# Patient Record
Sex: Female | Born: 1985 | Race: Black or African American | Hispanic: No | Marital: Single | Smoking: Never smoker
Health system: Southern US, Community
[De-identification: ages and names within clinical notes are randomized; demographics above are authoritative.]

## PROBLEM LIST (undated history)

## (undated) DIAGNOSIS — J45909 Unspecified asthma, uncomplicated: Secondary | ICD-10-CM

---

## 2020-01-05 ENCOUNTER — Encounter (HOSPITAL_COMMUNITY): Payer: Self-pay | Admitting: *Deleted

## 2020-01-05 ENCOUNTER — Emergency Department (HOSPITAL_COMMUNITY)
Admission: EM | Admit: 2020-01-05 | Discharge: 2020-01-05 | Disposition: A | Discharge status: Home / Self Care | Payer: Medicaid - Out of State | Attending: Emergency Medicine | Admitting: Emergency Medicine

## 2020-01-05 ENCOUNTER — Emergency Department (HOSPITAL_COMMUNITY): Payer: Medicaid - Out of State

## 2020-01-05 ENCOUNTER — Other Ambulatory Visit: Payer: Self-pay

## 2020-01-05 DIAGNOSIS — N83291 Other ovarian cyst, right side: Secondary | ICD-10-CM | POA: Diagnosis not present

## 2020-01-05 DIAGNOSIS — J45909 Unspecified asthma, uncomplicated: Secondary | ICD-10-CM | POA: Insufficient documentation

## 2020-01-05 DIAGNOSIS — R1012 Left upper quadrant pain: Secondary | ICD-10-CM | POA: Insufficient documentation

## 2020-01-05 DIAGNOSIS — R1032 Left lower quadrant pain: Secondary | ICD-10-CM | POA: Diagnosis present

## 2020-01-05 DIAGNOSIS — D259 Leiomyoma of uterus, unspecified: Secondary | ICD-10-CM | POA: Insufficient documentation

## 2020-01-05 DIAGNOSIS — N83201 Unspecified ovarian cyst, right side: Secondary | ICD-10-CM

## 2020-01-05 HISTORY — DX: Unspecified asthma, uncomplicated: J45.909

## 2020-01-05 LAB — URINALYSIS, ROUTINE W REFLEX MICROSCOPIC
Bacteria, UA: NONE SEEN
Bilirubin Urine: NEGATIVE
Glucose, UA: NEGATIVE mg/dL
Ketones, ur: NEGATIVE mg/dL
Leukocytes,Ua: NEGATIVE
Nitrite: NEGATIVE
Protein, ur: NEGATIVE mg/dL
Specific Gravity, Urine: 1.024 (ref 1.005–1.030)
pH: 5 (ref 5.0–8.0)

## 2020-01-05 LAB — DIFFERENTIAL
Abs Immature Granulocytes: 0.05 10*3/uL (ref 0.00–0.07)
Basophils Absolute: 0 10*3/uL (ref 0.0–0.1)
Basophils Relative: 0 %
Eosinophils Absolute: 0.1 10*3/uL (ref 0.0–0.5)
Eosinophils Relative: 1 %
Immature Granulocytes: 0 %
Lymphocytes Relative: 13 %
Lymphs Abs: 1.9 10*3/uL (ref 0.7–4.0)
Monocytes Absolute: 1 10*3/uL (ref 0.1–1.0)
Monocytes Relative: 7 %
Neutro Abs: 12 10*3/uL — ABNORMAL HIGH (ref 1.7–7.7)
Neutrophils Relative %: 79 %

## 2020-01-05 LAB — I-STAT BETA HCG BLOOD, ED (MC, WL, AP ONLY): I-stat hCG, quantitative: <5 m[IU]/mL (ref <5)

## 2020-01-05 LAB — CBC
HCT: 40.5 % (ref 36.0–46.0)
Hemoglobin: 13 g/dL (ref 12.0–15.0)
MCH: 28.3 pg (ref 26.0–34.0)
MCHC: 32.1 g/dL (ref 30.0–36.0)
MCV: 88 fL (ref 80.0–100.0)
Platelets: 347 10*3/uL (ref 150–400)
RBC: 4.6 MIL/uL (ref 3.87–5.11)
RDW: 15.1 % (ref 11.5–15.5)
WBC: 19 10*3/uL — ABNORMAL HIGH (ref 4.0–10.5)
nRBC: 0 % (ref 0.0–0.2)

## 2020-01-05 LAB — COMPREHENSIVE METABOLIC PANEL
ALT: 19 U/L (ref 0–44)
AST: 15 U/L (ref 15–41)
Albumin: 4.6 g/dL (ref 3.5–5.0)
Alkaline Phosphatase: 71 U/L (ref 38–126)
Anion gap: 10 (ref 5–15)
BUN: 19 mg/dL (ref 6–20)
CO2: 24 mmol/L (ref 22–32)
Calcium: 9.6 mg/dL (ref 8.9–10.3)
Chloride: 104 mmol/L (ref 98–111)
Creatinine, Ser: 0.82 mg/dL (ref 0.44–1.00)
GFR calc Af Amer: >60 mL/min (ref >60)
GFR calc non Af Amer: >60 mL/min (ref >60)
Glucose, Bld: 106 mg/dL — ABNORMAL HIGH (ref 70–99)
Potassium: 4.3 mmol/L (ref 3.5–5.1)
Sodium: 138 mmol/L (ref 135–145)
Total Bilirubin: 0.4 mg/dL (ref 0.3–1.2)
Total Protein: 8.2 g/dL — ABNORMAL HIGH (ref 6.5–8.1)

## 2020-01-05 LAB — LIPASE, BLOOD: Lipase: 85 U/L — ABNORMAL HIGH (ref 11–51)

## 2020-01-05 MED ORDER — SODIUM CHLORIDE (PF) 0.9 % IJ SOLN
INTRAMUSCULAR | Status: AC
  Filled 2020-01-05: qty 50

## 2020-01-05 MED ORDER — SODIUM CHLORIDE 0.9 % IV BOLUS
1000.0000 mL | Freq: Once | INTRAVENOUS | Status: AC
  Administered 2020-01-05: 1000 mL via INTRAVENOUS

## 2020-01-05 MED ORDER — ONDANSETRON HCL 4 MG/2ML IJ SOLN
4.0000 mg | Freq: Once | INTRAMUSCULAR | Status: AC
  Administered 2020-01-05: 4 mg via INTRAVENOUS
  Filled 2020-01-05: qty 2

## 2020-01-05 MED ORDER — SODIUM CHLORIDE 0.9% FLUSH: 3.0000 mL | Freq: Once | INTRAVENOUS | Status: DC

## 2020-01-05 MED ORDER — HYDROCODONE-ACETAMINOPHEN 5-325 MG PO TABS: 1.0000 | ORAL_TABLET | ORAL | 0 refills | Status: AC | PRN

## 2020-01-05 MED ORDER — PIPERACILLIN-TAZOBACTAM 3.375 G IVPB 30 MIN
3.3750 g | Freq: Once | INTRAVENOUS | Status: AC
  Administered 2020-01-05: 3.375 g via INTRAVENOUS
  Filled 2020-01-05: qty 50

## 2020-01-05 MED ORDER — IOHEXOL 300 MG/ML  SOLN
100.0000 mL | Freq: Once | INTRAMUSCULAR | Status: AC | PRN
  Administered 2020-01-05: 100 mL via INTRAVENOUS

## 2020-01-05 MED ORDER — MORPHINE SULFATE (PF) 4 MG/ML IV SOLN
4.0000 mg | Freq: Once | INTRAVENOUS | Status: AC
  Administered 2020-01-05: 4 mg via INTRAVENOUS
  Filled 2020-01-05: qty 1

## 2020-01-05 NOTE — ED Provider Notes (Signed)
[facility] DEPT Provider Note   CSN: [id number] Arrival date & time: 01/05/20  1309     History Chief Complaint  Patient presents with  . Abdominal Pain    Left    Taylor Nicholson is a 34 y.o. female with PMHx asthma who presents to the ED today complaining of sudden onset, constant, achy/sharp, left sided abdominal pain that began at 8 AM this morning. PT also complains of nausea, 1 episode of nonbilious nonbloody emesis, and 2 looser stools.  Reports she has never had pain like this in the past.  She is denying any fever, chills, chest pain, shortness of breath, blood in stool, urinary symptoms, pelvic pain, any other associated symptoms. LNMP 2 weeks ago.   The history is provided by the patient.       Past Medical History:  Diagnosis Date  . Asthma     There are no problems to display for this patient.   History reviewed. No pertinent surgical history.   OB History   No obstetric history on file.     No family history on file.  Social History   Tobacco Use  . Smoking status: Never Smoker  . Smokeless tobacco: Never Used  Substance Use Topics  . Alcohol use: Never  . Drug use: Never    Home Medications Prior to Admission medications   Medication Sig Start Date End Date Taking? Authorizing Provider  albuterol (VENTOLIN HFA) 108 (90 Base) MCG/ACT inhaler Inhale 2 puffs into the lungs every 6 (six) hours as needed for wheezing or shortness of breath.   Yes [provider]  HYDROcodone-acetaminophen (NORCO/VICODIN) 5-325 MG tablet Take 1 tablet by mouth every 4 (four) hours as needed for severe pain. 01/05/20   Eustaquio Maize, PA-C    Allergies    Patient has no known allergies.  Review of Systems   Review of Systems  Constitutional: Negative for chills and fever.  Respiratory: Negative for shortness of breath.   Cardiovascular: Negative for chest pain.  Gastrointestinal: Positive for abdominal pain, diarrhea,  nausea and vomiting.  Genitourinary: Negative for difficulty urinating, dysuria, flank pain, frequency, pelvic pain and vaginal pain.  Musculoskeletal: Negative for myalgias.  All other systems reviewed and are negative.   Physical Exam Updated Vital Signs BP 107/74   Pulse 73   Temp 98.6 F (37 C) (Oral)   Resp 20   Wt 73.9 kg   SpO2 100%   Physical Exam Vitals and nursing note reviewed.  Constitutional:      Appearance: She is not ill-appearing or diaphoretic.  HENT:     Head: Normocephalic and atraumatic.  Eyes:     Conjunctiva/sclera: Conjunctivae normal.  Cardiovascular:     Rate and Rhythm: Normal rate and regular rhythm.  Pulmonary:     Effort: Pulmonary effort is normal.     Breath sounds: Normal breath sounds. No wheezing, rhonchi or rales.  Chest:     Chest wall: No tenderness.  Abdominal:     Palpations: Abdomen is soft.     Tenderness: There is abdominal tenderness in the periumbilical area, left upper quadrant and left lower quadrant. There is no right CVA tenderness, left CVA tenderness, guarding or rebound.  Musculoskeletal:     Cervical back: Neck supple.  Skin:    General: Skin is warm and dry.  Neurological:     Mental Status: She is alert.     ED Results / Procedures / Treatments   Labs (all labs ordered  are listed, but only abnormal results are displayed) Labs Reviewed  LIPASE, BLOOD - Abnormal; Notable for the following components:      Result Value   Lipase 85 (*)    All other components within normal limits  COMPREHENSIVE METABOLIC PANEL - Abnormal; Notable for the following components:   Glucose, Bld 106 (*)    Total Protein 8.2 (*)    All other components within normal limits  CBC - Abnormal; Notable for the following components:   WBC 19.0 (*)    All other components within normal limits  URINALYSIS, ROUTINE W REFLEX MICROSCOPIC - Abnormal; Notable for the following components:   Hgb urine dipstick SMALL (*)    All other components  within normal limits  DIFFERENTIAL - Abnormal; Notable for the following components:   Neutro Abs 12.0 (*)    All other components within normal limits  I-STAT BETA HCG BLOOD, ED ([facility], [facility], [facility] ONLY)    EKG None  Radiology US Transvaginal Non-OB  Result Date: 01/05/2020 CLINICAL DATA:  Initial evaluation for acute left lower quadrant pain. EXAM: TRANSABDOMINAL AND TRANSVAGINAL ULTRASOUND OF PELVIS DOPPLER ULTRASOUND OF OVARIES TECHNIQUE: Both transabdominal and transvaginal ultrasound examinations of the pelvis were performed. Transabdominal technique was performed for global imaging of the pelvis including uterus, ovaries, adnexal regions, and pelvic cul-de-sac. It was necessary to proceed with endovaginal exam following the transabdominal exam to visualize the uterus, endometrium, and ovaries. Color and duplex Doppler ultrasound was utilized to evaluate blood flow to the ovaries. COMPARISON:  Prior CT from earlier the same day. FINDINGS: Uterus Measurements: 7.7 x 3.6 x 4.3 cm = volume: 61.7 mL. 1.3 x 0.8 x 1.5 cm exophytic densely calcified fibroid seen along the left aspect of the uterus, also seen on prior CT. Endometrium Thickness: 8 mm.  No focal abnormality visualized. Right ovary Measurements: 4.7 x 3.7 x 3.8 cm = volume: 35.1 mL. 3.4 x 3.3 x 2.7 cm simple anechoic cyst, most compatible with a normal physiologic follicular cyst. Left ovary Not visualized.  No adnexal mass. Pulsed Doppler evaluation of the right ovary demonstrates normal low-resistance arterial and venous waveforms. Other findings No abnormal free fluid. IMPRESSION: 1. 3.4 cm simple right ovarian cyst, most compatible with a normal physiologic follicular cyst. This has benign characteristics and is a common finding in premenopausal females. No imaging follow up is required. This follows consensus guidelines: Simple Adnexal Cysts: SRU Consensus Conference Update on Follow-up and Reporting. Radiology 2019; [id number]. 2.  Nonvisualization of the left ovary. No left adnexal mass or free fluid within the pelvis. 3. 1.5 cm densely calcified fibroid at the left aspect of the uterus. 4. No evidence for ovarian torsion or other acute abnormality. Electronically Signed   By: Jeannine Boga M.D.   On: 01/05/2020 20:53   US Pelvis Complete  Result Date: 01/05/2020 CLINICAL DATA:  Initial evaluation for acute left lower quadrant pain. EXAM: TRANSABDOMINAL AND TRANSVAGINAL ULTRASOUND OF PELVIS DOPPLER ULTRASOUND OF OVARIES TECHNIQUE: Both transabdominal and transvaginal ultrasound examinations of the pelvis were performed. Transabdominal technique was performed for global imaging of the pelvis including uterus, ovaries, adnexal regions, and pelvic cul-de-sac. It was necessary to proceed with endovaginal exam following the transabdominal exam to visualize the uterus, endometrium, and ovaries. Color and duplex Doppler ultrasound was utilized to evaluate blood flow to the ovaries. COMPARISON:  Prior CT from earlier the same day. FINDINGS: Uterus Measurements: 7.7 x 3.6 x 4.3 cm = volume: 61.7 mL. 1.3 x 0.8 x 1.5 cm  exophytic densely calcified fibroid seen along the left aspect of the uterus, also seen on prior CT. Endometrium Thickness: 8 mm.  No focal abnormality visualized. Right ovary Measurements: 4.7 x 3.7 x 3.8 cm = volume: 35.1 mL. 3.4 x 3.3 x 2.7 cm simple anechoic cyst, most compatible with a normal physiologic follicular cyst. Left ovary Not visualized.  No adnexal mass. Pulsed Doppler evaluation of the right ovary demonstrates normal low-resistance arterial and venous waveforms. Other findings No abnormal free fluid. IMPRESSION: 1. 3.4 cm simple right ovarian cyst, most compatible with a normal physiologic follicular cyst. This has benign characteristics and is a common finding in premenopausal females. No imaging follow up is required. This follows consensus guidelines: Simple Adnexal Cysts: SRU Consensus Conference Update on  Follow-up and Reporting. Radiology 2019; [id number]. 2. Nonvisualization of the left ovary. No left adnexal mass or free fluid within the pelvis. 3. 1.5 cm densely calcified fibroid at the left aspect of the uterus. 4. No evidence for ovarian torsion or other acute abnormality. Electronically Signed   By: Jeannine Boga M.D.   On: 01/05/2020 20:53   CT Abdomen Pelvis W Contrast  Result Date: 01/05/2020 CLINICAL DATA:  Left lower quadrant pain EXAM: CT ABDOMEN AND PELVIS WITH CONTRAST TECHNIQUE: Multidetector CT imaging of the abdomen and pelvis was performed using the standard protocol following bolus administration of intravenous contrast. CONTRAST:  1103mL OMNIPAQUE IOHEXOL 300 MG/ML  SOLN COMPARISON:  None. FINDINGS: Lower chest: No acute abnormality. Hepatobiliary: No focal liver abnormality is seen. No gallstones, gallbladder wall thickening, or biliary dilatation. Pancreas: Unremarkable. Spleen: Unremarkable. Adrenals/Urinary Tract: Adrenals are normal. Kidneys demonstrate normal enhancement. No hydronephrosis. Stomach/Bowel: Moderate hiatal hernia. Stomach is otherwise within normal limits. Small bowel is normal in caliber. Few colonic diverticula. The colon is fluid and air-filled but nondistended. Normal appendix. Vascular/Lymphatic: No significant vascular findings are present. No enlarged abdominal or pelvic lymph nodes. Reproductive: 1.6 cm calcified fibroid along the left aspect of the ureters appears subserosal. Right adnexal cystic lesion measuring up to 3.8 cm. Other: Fat containing left inguinal hernia. Musculoskeletal: Unremarkable. IMPRESSION: Fluid and air-filled, but nondistended colon. Right adnexal cystic lesion measuring 3.8 cm. Moderate hiatal hernia. Electronically Signed   By: Macy Mis M.D.   On: 01/05/2020 19:30   [place] Art/Ven Flow Abd Pelv Doppler  Result Date: 01/05/2020 CLINICAL DATA:  Initial evaluation for acute left lower quadrant pain. EXAM: TRANSABDOMINAL AND  TRANSVAGINAL ULTRASOUND OF PELVIS DOPPLER ULTRASOUND OF OVARIES TECHNIQUE: Both transabdominal and transvaginal ultrasound examinations of the pelvis were performed. Transabdominal technique was performed for global imaging of the pelvis including uterus, ovaries, adnexal regions, and pelvic cul-de-sac. It was necessary to proceed with endovaginal exam following the transabdominal exam to visualize the uterus, endometrium, and ovaries. Color and duplex Doppler ultrasound was utilized to evaluate blood flow to the ovaries. COMPARISON:  Prior CT from earlier the same day. FINDINGS: Uterus Measurements: 7.7 x 3.6 x 4.3 cm = volume: 61.7 mL. 1.3 x 0.8 x 1.5 cm exophytic densely calcified fibroid seen along the left aspect of the uterus, also seen on prior CT. Endometrium Thickness: 8 mm.  No focal abnormality visualized. Right ovary Measurements: 4.7 x 3.7 x 3.8 cm = volume: 35.1 mL. 3.4 x 3.3 x 2.7 cm simple anechoic cyst, most compatible with a normal physiologic follicular cyst. Left ovary Not visualized.  No adnexal mass. Pulsed Doppler evaluation of the right ovary demonstrates normal low-resistance arterial and venous waveforms. Other findings No abnormal free fluid. IMPRESSION:  1. 3.4 cm simple right ovarian cyst, most compatible with a normal physiologic follicular cyst. This has benign characteristics and is a common finding in premenopausal females. No imaging follow up is required. This follows consensus guidelines: Simple Adnexal Cysts: SRU Consensus Conference Update on Follow-up and Reporting. Radiology 2019; [id number]. 2. Nonvisualization of the left ovary. No left adnexal mass or free fluid within the pelvis. 3. 1.5 cm densely calcified fibroid at the left aspect of the uterus. 4. No evidence for ovarian torsion or other acute abnormality. Electronically Signed   By: Jeannine Boga M.D.   On: 01/05/2020 20:53    Procedures Procedures (including critical care time)  Medications Ordered in  ED Medications  sodium chloride flush (NS) 0.9 % injection 3 mL (0 mLs Intravenous Hold 01/05/20 1810)  sodium chloride (PF) 0.9 % injection (0 mLs  Hold 01/05/20 1846)  piperacillin-tazobactam (ZOSYN) IVPB 3.375 g (0 g Intravenous Stopped 01/05/20 2027)  sodium chloride 0.9 % bolus 1,000 mL (0 mLs Intravenous Stopped 01/05/20 2027)  ondansetron (ZOFRAN) injection 4 mg (4 mg Intravenous Given 01/05/20 1836)  morphine 4 MG/ML injection 4 mg (4 mg Intravenous Given 01/05/20 1836)  iohexol (OMNIPAQUE) 300 MG/ML solution 100 mL (100 mLs Intravenous Contrast Given 01/05/20 1857)    ED Course  I have reviewed the triage vital signs and the nursing notes.  Pertinent labs & imaging results that were available during my care of the patient were reviewed by me and considered in my medical decision making (see chart for details).  34 year old female presents the ED complaining of left lower quadrant/left upper quadrant abdominal pain, nausea, 2 looser stools that began this morning.  On arrival to the ED patient is afebrile, nontachycardic, nontachypneic.  She has tenderness to the periumbilical, left upper quadrant, left lower quadrant.  No CVA tenderness.  No urinary symptoms, no pelvic pain.  She denies heavy alcohol use.  Given amount of discomfort will obtain CT scan.  Reevaluate.  Morphine, IV fluids, Zofran given for pain.   CBC with white blood cell count of 19,000.  Will empirically start on Zosyn with concern for intra-abdominal infection.  CMP without any electrolyte abnormalities.  Analysis with small amount of hemoglobin although patient states she is currently menstruating, she states that she has irregular periods and last had one about 2 weeks ago which is normal for her.  She is denying any pelvic pain.  Her lipase is mildly elevated at 85 although not 3 times the upper limit of normal.   CT scan without acute findings.  No pancreatitis.  Does have a right adnexal cystic lesion.  Will obtain  pelvic ultrasound at this time.   Ultrasound with ovarian cyst on right side and fibroid on left.  No torsion.  On repeat exam patient is lying comfortably in bed texting on phone.  She has no abdominal tenderness on repeat exam.  Will she is stable for discharge at this time.  I will prescribe a very short course of pain medication for her to take as needed although I have stated that she should take ibuprofen and if she has breakthrough pain she can take the narcotic pain medication.  I have encouraged increased fluid intake.  Patient is to follow-up with OB/GYN regarding her ultrasound findings today.  Patient declines pelvic exam at this time.  She states she is not having any pelvic pain and last had intercourse in September, is not concerned for STDs today.  Feel that this is appropriate.  Patient to be discharged home at this time.  Strict return precautions have been discussed with patient.  She is in agreement plan and stable for discharge home.   This note was prepared using Dragon voice recognition software and may include unintentional dictation errors due to the inherent limitations of voice recognition software.    MDM Rules/Calculators/A&P                      Final Clinical Impression(s) / ED Diagnoses Final diagnoses:  Left upper quadrant abdominal pain  Uterine leiomyoma, unspecified location  Cyst of right ovary    Rx / DC Orders ED Discharge Orders         Ordered    HYDROcodone-acetaminophen (NORCO/VICODIN) 5-325 MG tablet  Every 4 hours PRN     01/05/20 2148           Discharge Instructions     Please take pain medication as prescribed. Increase your fluids to stay hydrated.   Follow up with OBGYN regarding your ovarian cyst/fibroids  Return to the ED for any worsening symptoms including worsening pain, inability to tolerate fluids, chest pain, shortness of breath, vomiting blood, pelvic pain.        Eustaquio Maize, PA-C 01/05/20 2153    Carmin Muskrat, MD 01/05/20 [phone]

## 2020-01-05 NOTE — ED Notes (Signed)
Patient in CT at this time.

## 2020-01-05 NOTE — ED Triage Notes (Signed)
EMS states pt developed pain in LLQ this morning, some nausea, 2 loose stools, 110/88-80-18-98% RA CBG 99 98.1

## 2020-01-05 NOTE — Discharge Instructions (Addendum)
Please take pain medication as prescribed. Increase your fluids to stay hydrated.   Follow up with OBGYN regarding your ovarian cyst/fibroids  Return to the ED for any worsening symptoms including worsening pain, inability to tolerate fluids, chest pain, shortness of breath, vomiting blood, pelvic pain.

## 2021-05-17 IMAGING — US US PELVIS COMPLETE
1 series · 13 of 25 positions shown · non-contrast
Comparison: Prior CT from earlier the same day.

CLINICAL DATA: Initial evaluation for acute left lower quadrant
pain.

EXAM:
TRANSABDOMINAL AND TRANSVAGINAL ULTRASOUND OF PELVIS
DOPPLER ULTRASOUND OF OVARIES
TECHNIQUE: Both transabdominal and transvaginal ultrasound examinations of the
pelvis were performed. Transabdominal technique was performed for
global imaging of the pelvis including uterus, ovaries, adnexal
regions, and pelvic cul-de-sac.
It was necessary to proceed with endovaginal exam following the
transabdominal exam to visualize the uterus, endometrium, and
ovaries. Color and duplex Doppler ultrasound was utilized to
evaluate blood flow to the ovaries.

[Series 1: us pelvis complete · 13 of 128 slices shown]
[im 1/128]
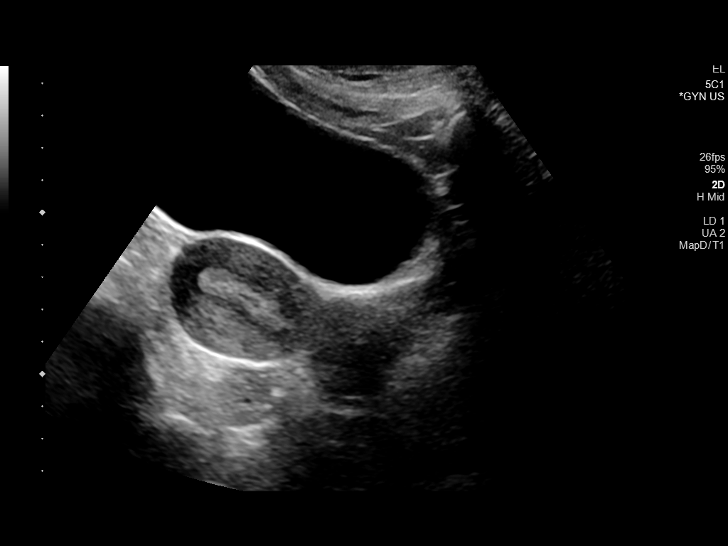
[im 11/128]
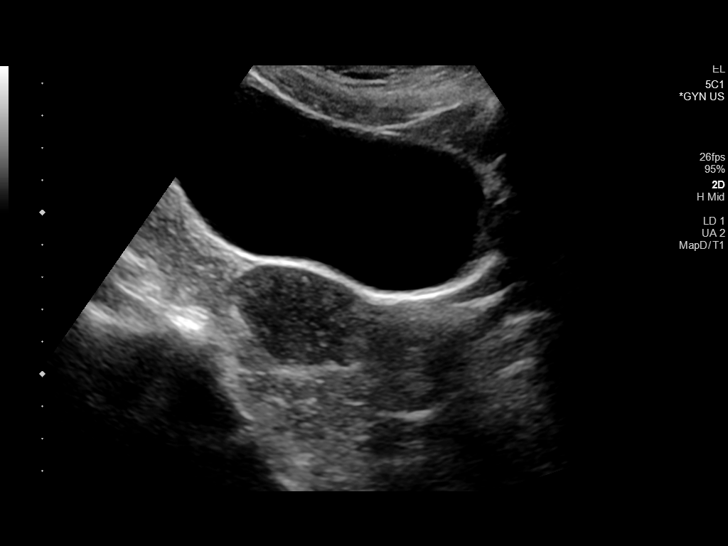
[im 22/128]
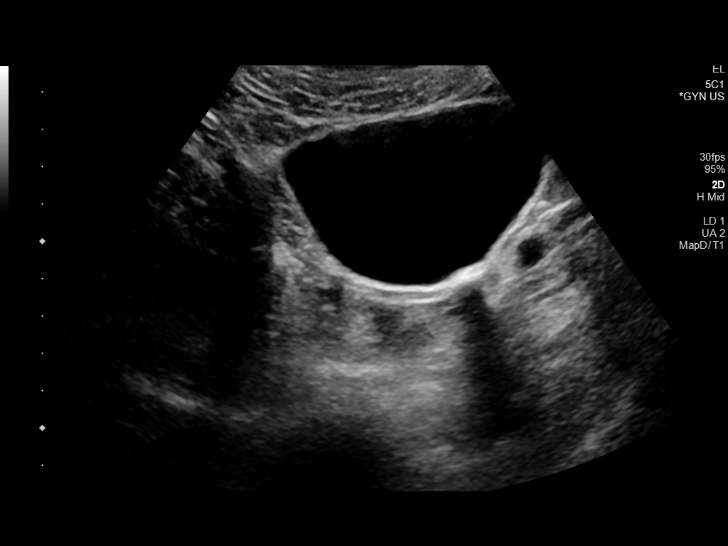
[im 32/128]
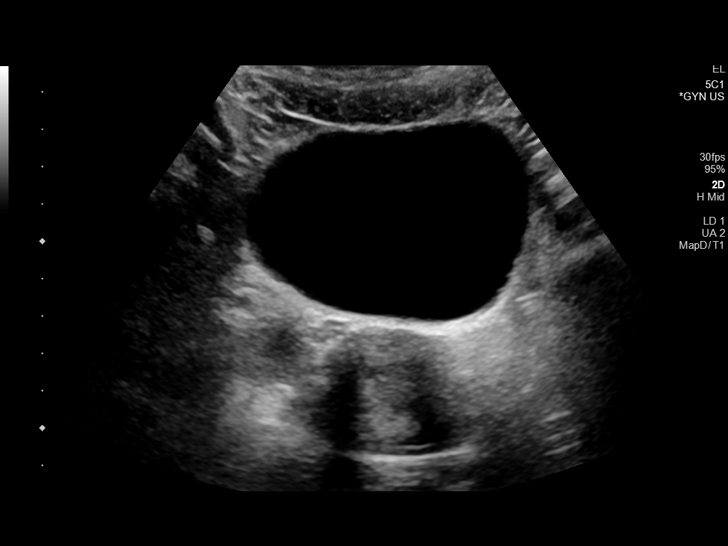
[im 43/128]
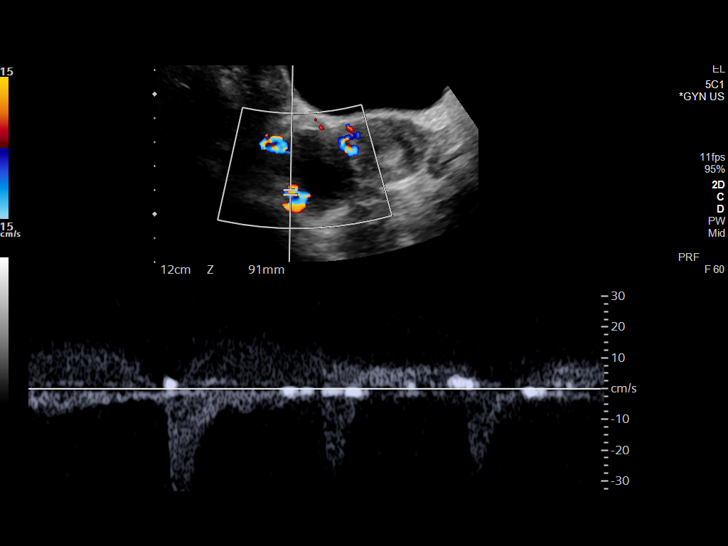
[im 53/128]
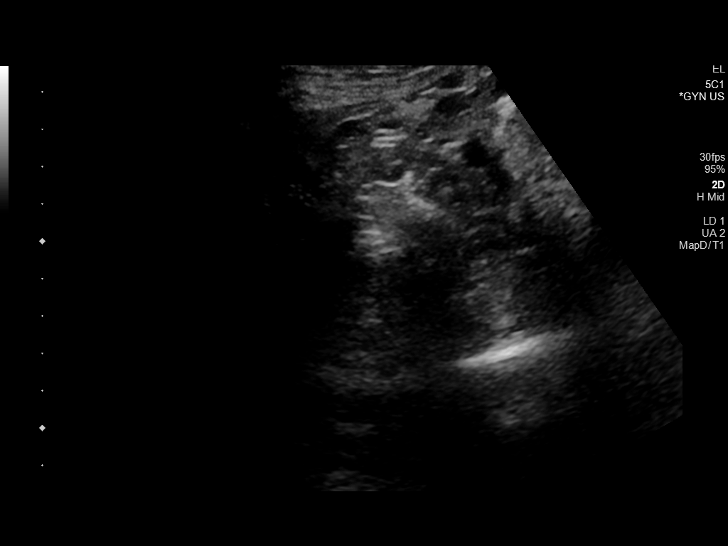
[im 64/128]
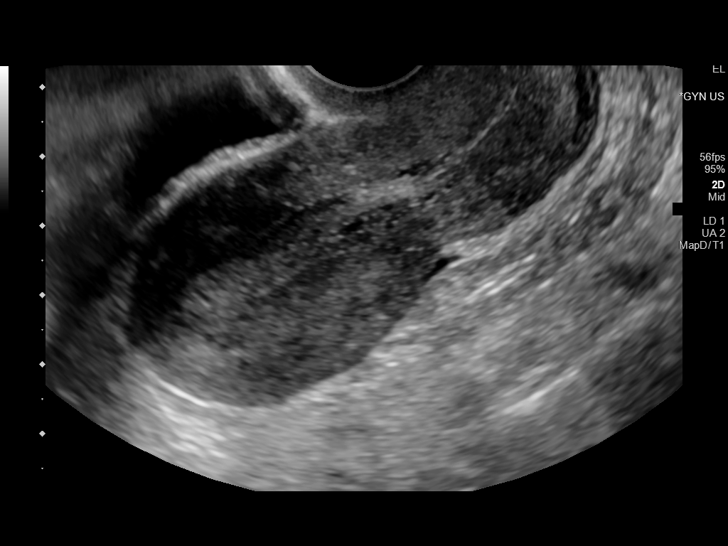
[im 75/128]
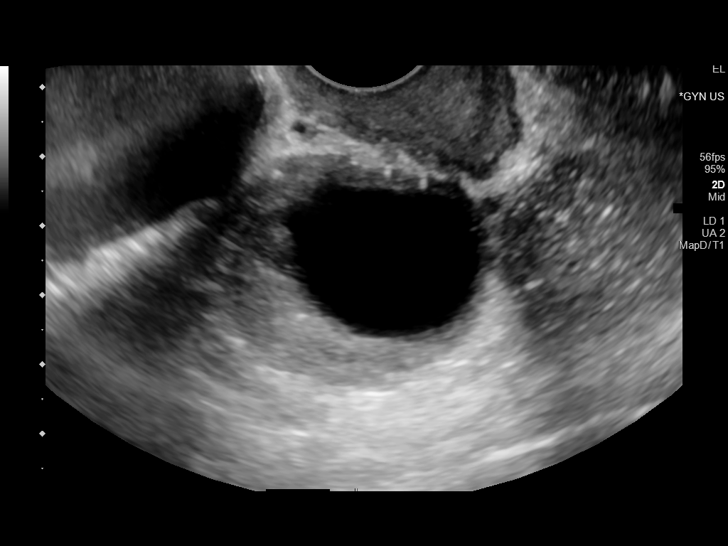
[im 85/128]
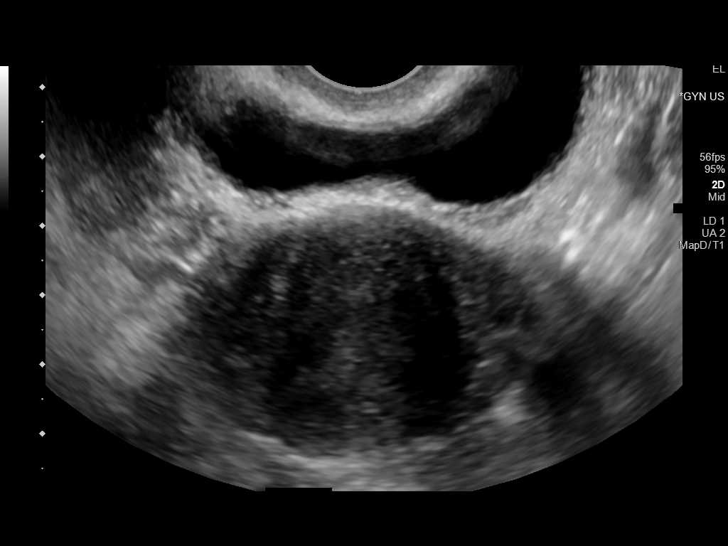
[im 96/128]
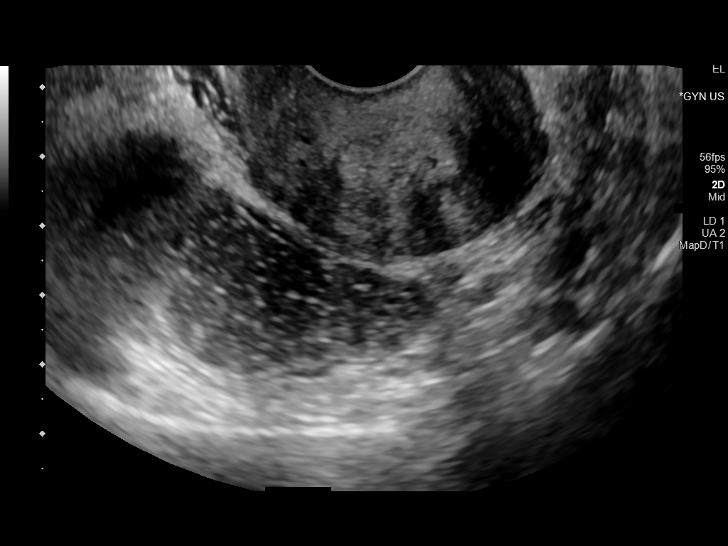
[im 106/128]
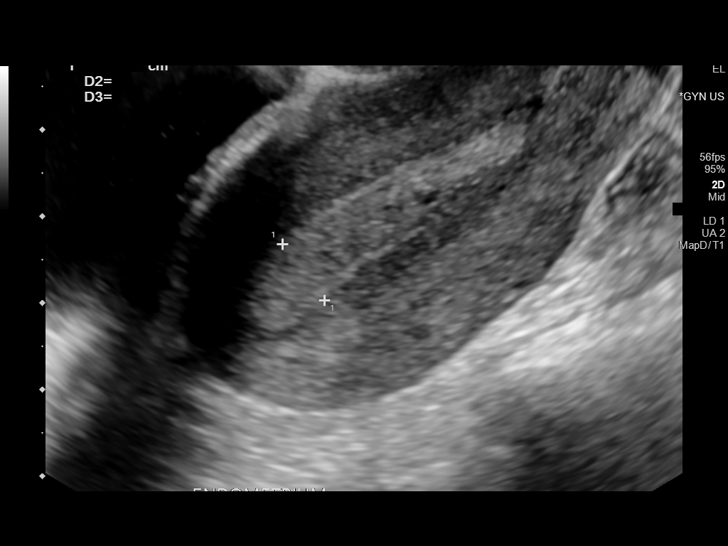
[im 117/128]
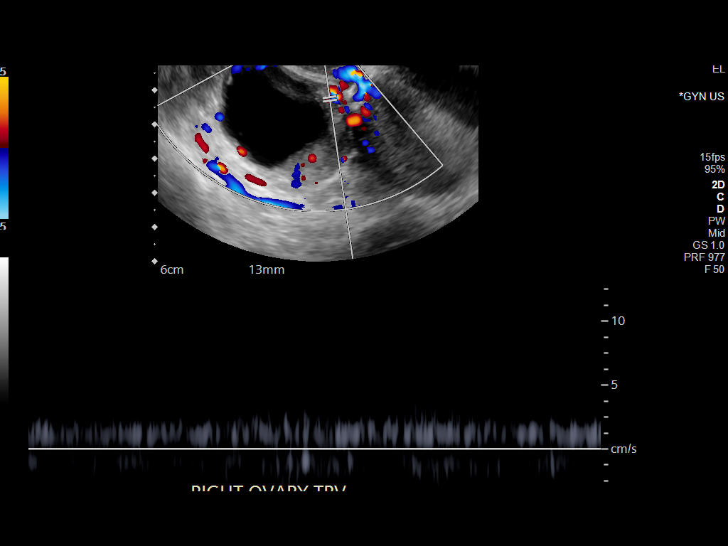
[im 128/128]
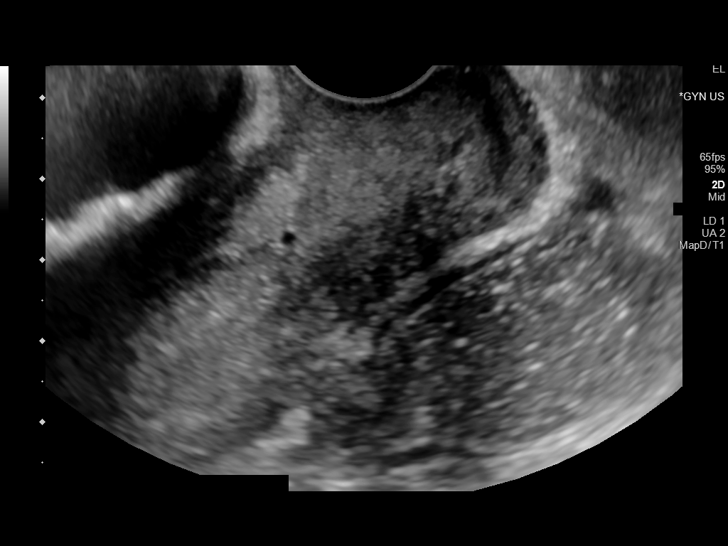

[13 of 25 positions shown; findings below may reference images not displayed]

FINDINGS: Uterus

Measurements: 7.7 x 3.6 x 4.3 cm = volume: 61.7 mL. 1.3 x 0.8 x
cm exophytic densely calcified fibroid seen along the left aspect of
the uterus, also seen on prior CT.

Endometrium

Thickness: 8 mm.  No focal abnormality visualized.

Right ovary

Measurements: 4.7 x 3.7 x 3.8 cm = volume: 35.1 mL. 3.4 x 3.3 x
cm simple anechoic cyst, most compatible with a normal physiologic
follicular cyst.

Left ovary

Not visualized.  No adnexal mass.

Pulsed Doppler evaluation of the right ovary demonstrates normal
low-resistance arterial and venous waveforms.

Other findings

No abnormal free fluid.
IMPRESSION: 1. 3.4 cm simple right ovarian cyst, most compatible with a normal
physiologic follicular cyst. This has benign characteristics and is
a common finding in premenopausal females. No imaging follow up is
required. This follows consensus guidelines: Simple Adnexal Cysts:
SRU Consensus Conference Update on Follow-up and Reporting.
2. Nonvisualization of the left ovary. No left adnexal mass or free
fluid within the pelvis.
[DATE] cm densely calcified fibroid at the left aspect of the
uterus.
4. No evidence for ovarian torsion or other acute abnormality.

## 2021-05-17 IMAGING — US US TRANSVAGINAL NON-OB
1 series · 13 of 25 positions shown · non-contrast
Comparison: Prior CT from earlier the same day.

CLINICAL DATA: Initial evaluation for acute left lower quadrant
pain.

EXAM:
TRANSABDOMINAL AND TRANSVAGINAL ULTRASOUND OF PELVIS
DOPPLER ULTRASOUND OF OVARIES
TECHNIQUE: Both transabdominal and transvaginal ultrasound examinations of the
pelvis were performed. Transabdominal technique was performed for
global imaging of the pelvis including uterus, ovaries, adnexal
regions, and pelvic cul-de-sac.
It was necessary to proceed with endovaginal exam following the
transabdominal exam to visualize the uterus, endometrium, and
ovaries. Color and duplex Doppler ultrasound was utilized to
evaluate blood flow to the ovaries.

[Series 1: us transvaginal non-ob · 13 of 128 slices shown]
[im 1/128]
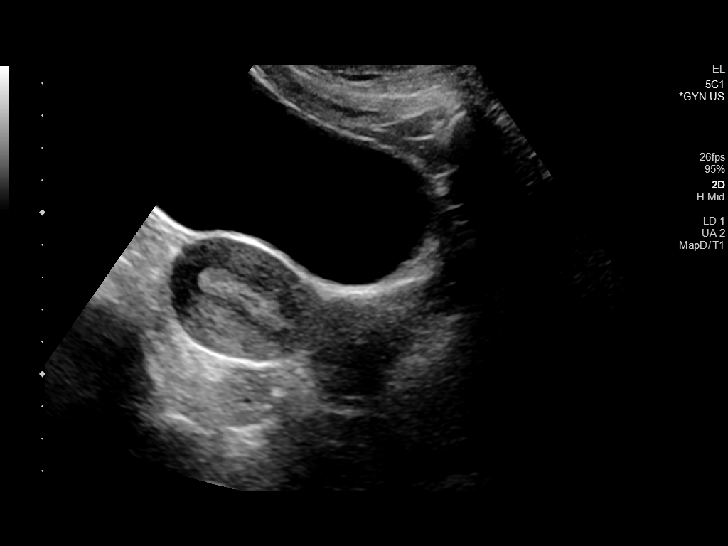
[im 11/128]
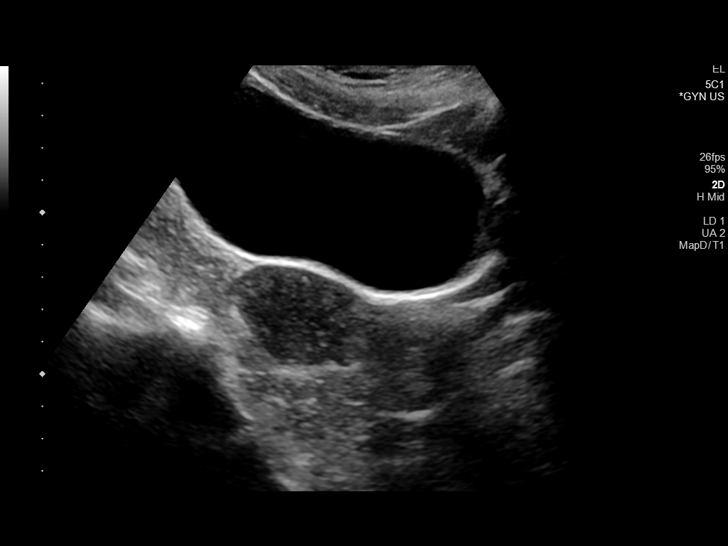
[im 22/128]
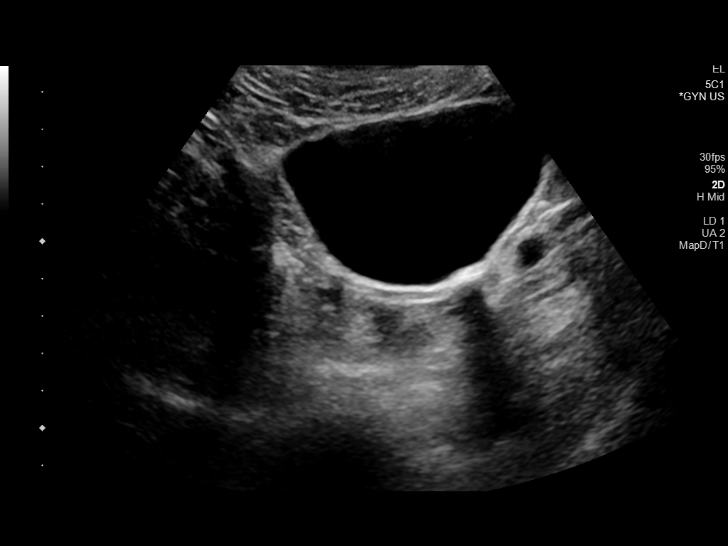
[im 32/128]
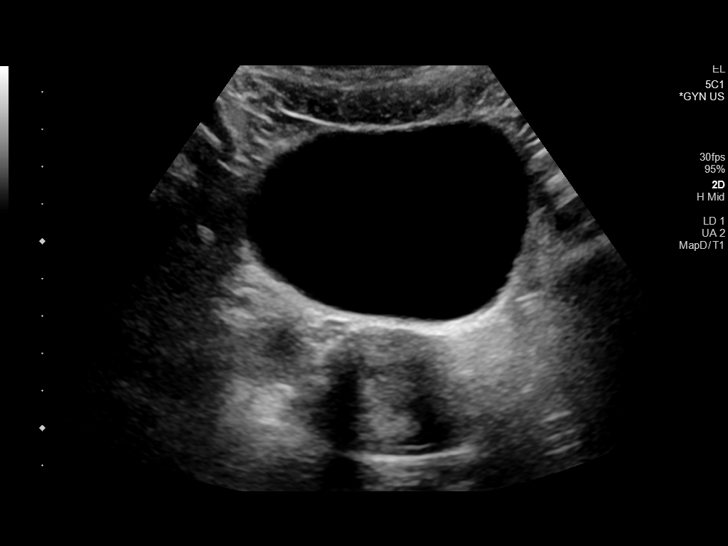
[im 43/128]
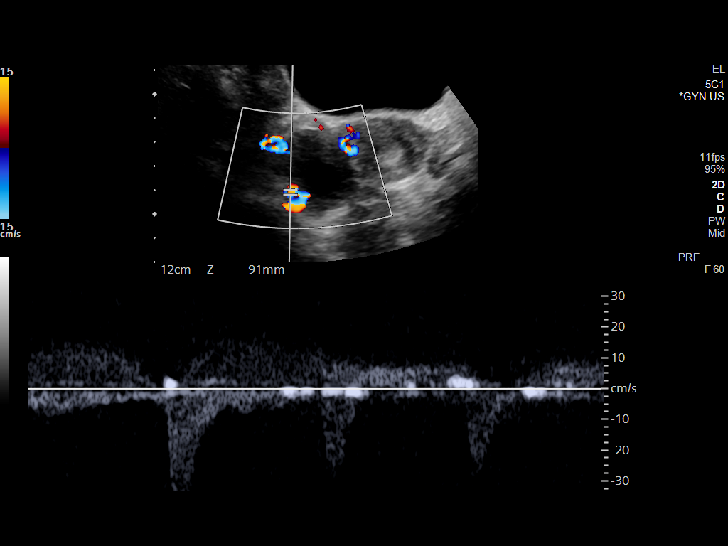
[im 53/128]
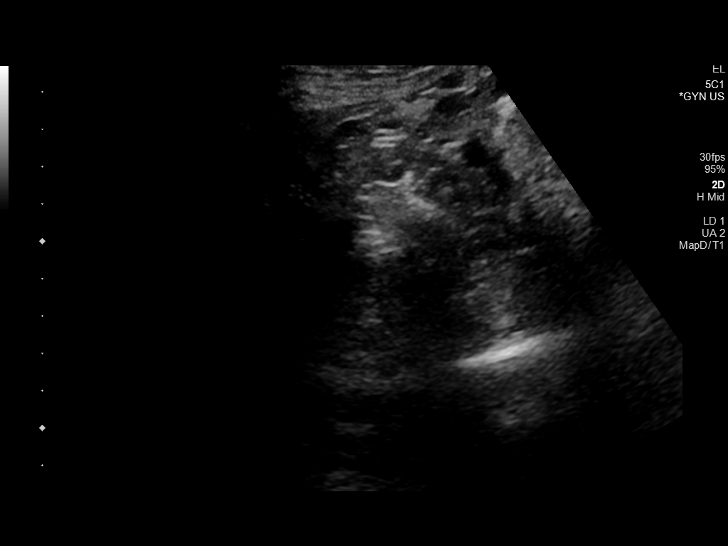
[im 64/128]
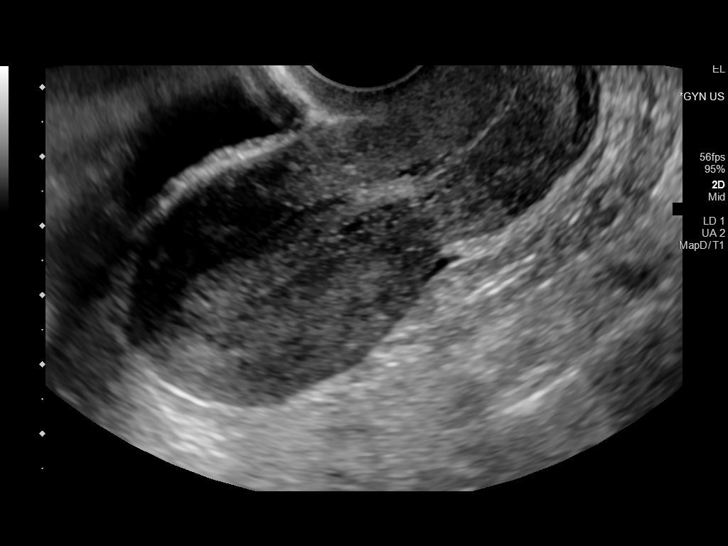
[im 75/128]
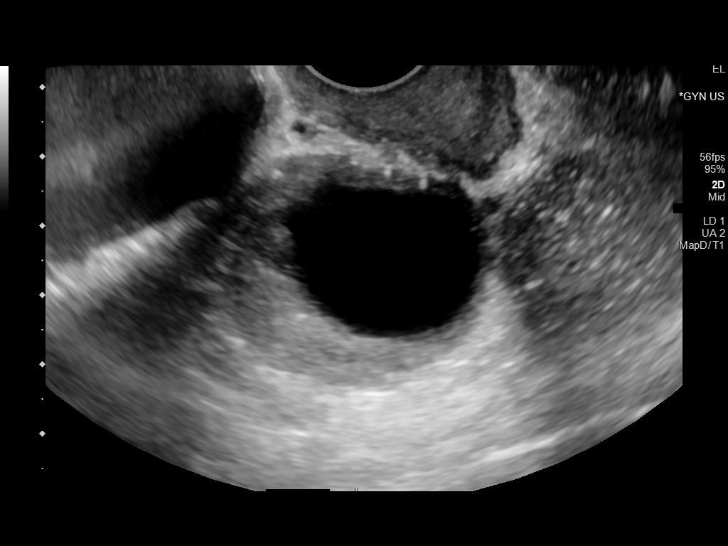
[im 85/128]
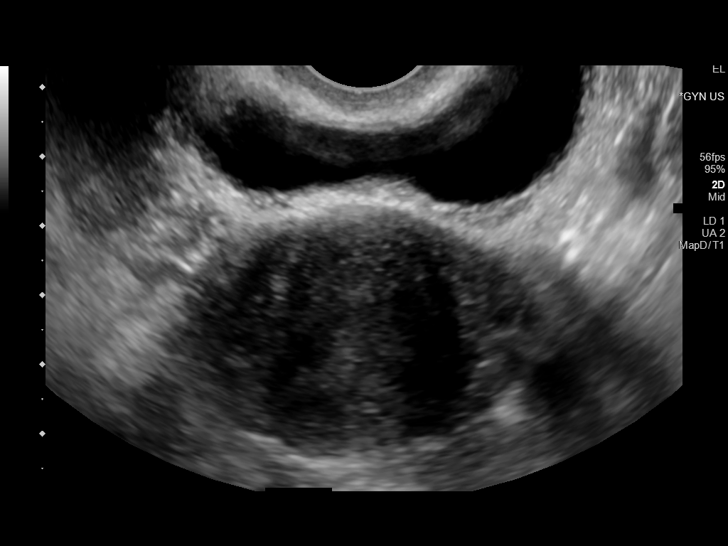
[im 96/128]
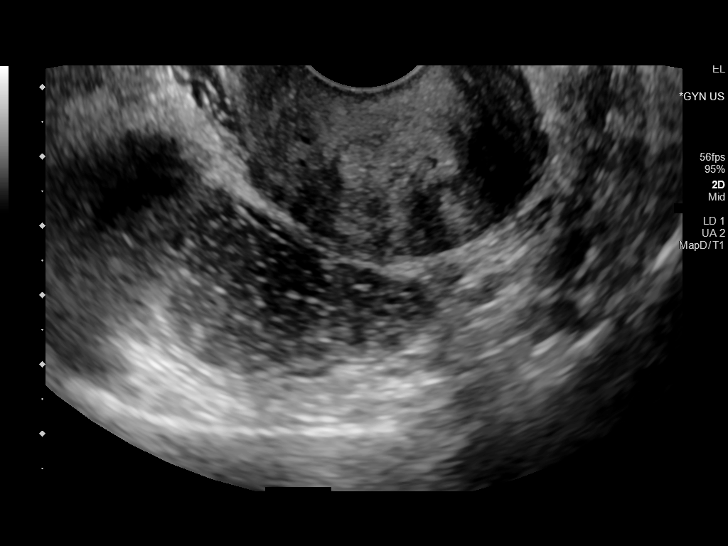
[im 106/128]
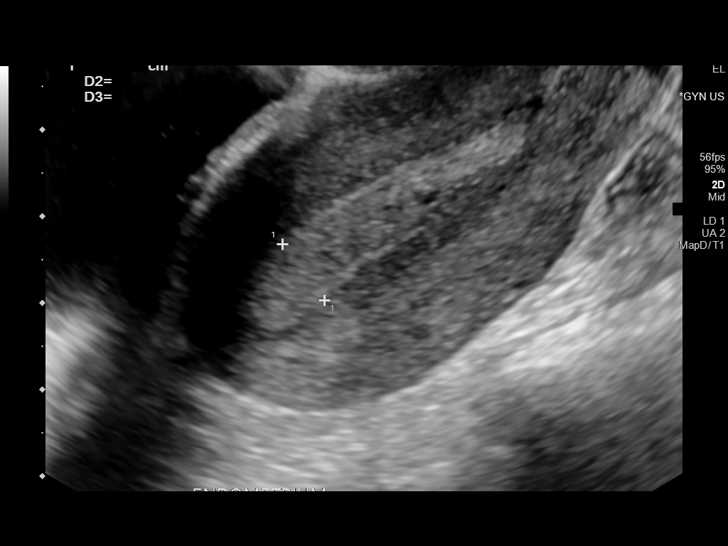
[im 117/128]
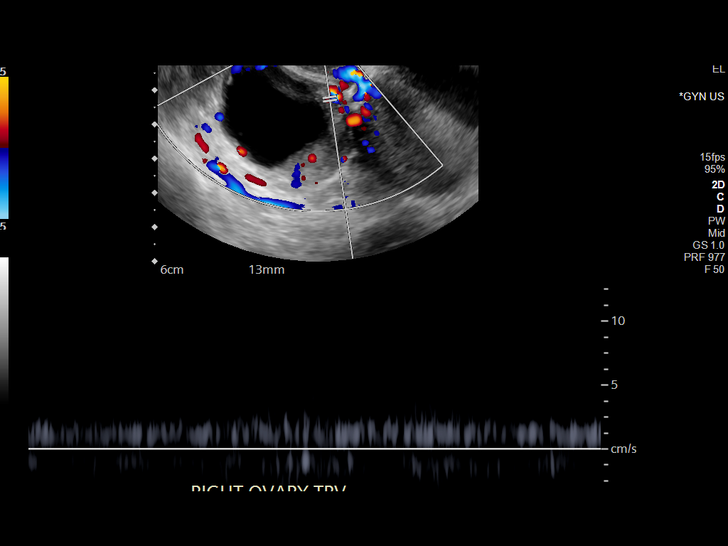
[im 128/128]
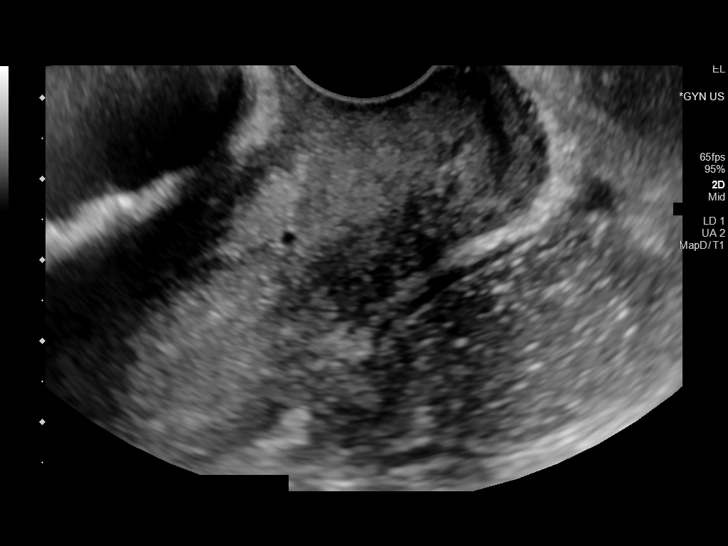

[13 of 25 positions shown; findings below may reference images not displayed]

FINDINGS: Uterus

Measurements: 7.7 x 3.6 x 4.3 cm = volume: 61.7 mL. 1.3 x 0.8 x
cm exophytic densely calcified fibroid seen along the left aspect of
the uterus, also seen on prior CT.

Endometrium

Thickness: 8 mm.  No focal abnormality visualized.

Right ovary

Measurements: 4.7 x 3.7 x 3.8 cm = volume: 35.1 mL. 3.4 x 3.3 x
cm simple anechoic cyst, most compatible with a normal physiologic
follicular cyst.

Left ovary

Not visualized.  No adnexal mass.

Pulsed Doppler evaluation of the right ovary demonstrates normal
low-resistance arterial and venous waveforms.

Other findings

No abnormal free fluid.
IMPRESSION: 1. 3.4 cm simple right ovarian cyst, most compatible with a normal
physiologic follicular cyst. This has benign characteristics and is
a common finding in premenopausal females. No imaging follow up is
required. This follows consensus guidelines: Simple Adnexal Cysts:
SRU Consensus Conference Update on Follow-up and Reporting.
2. Nonvisualization of the left ovary. No left adnexal mass or free
fluid within the pelvis.
[DATE] cm densely calcified fibroid at the left aspect of the
uterus.
4. No evidence for ovarian torsion or other acute abnormality.

## 2021-05-17 IMAGING — US US ART/VEN ABD/PELV/SCROTUM DOPPLER LTD
1 series · 13 of 25 positions shown · non-contrast
Comparison: Prior CT from earlier the same day.

CLINICAL DATA: Initial evaluation for acute left lower quadrant
pain.

EXAM:
TRANSABDOMINAL AND TRANSVAGINAL ULTRASOUND OF PELVIS
DOPPLER ULTRASOUND OF OVARIES
TECHNIQUE: Both transabdominal and transvaginal ultrasound examinations of the
pelvis were performed. Transabdominal technique was performed for
global imaging of the pelvis including uterus, ovaries, adnexal
regions, and pelvic cul-de-sac.
It was necessary to proceed with endovaginal exam following the
transabdominal exam to visualize the uterus, endometrium, and
ovaries. Color and duplex Doppler ultrasound was utilized to
evaluate blood flow to the ovaries.

[Series 1: us art/ven abd/pelv/scrotum doppler ltd · 13 of 128 slices shown]
[im 1/128]
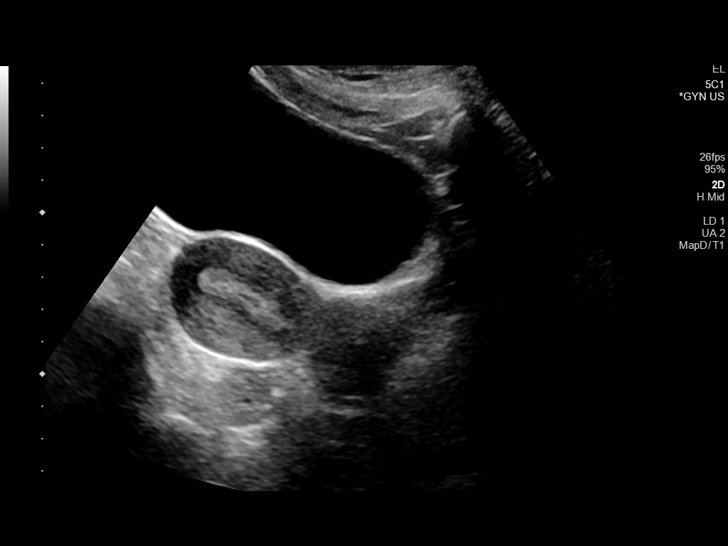
[im 11/128]
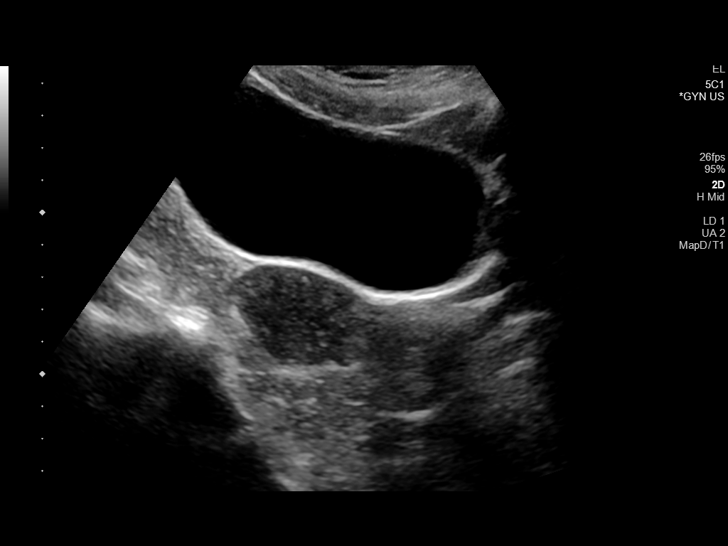
[im 22/128]
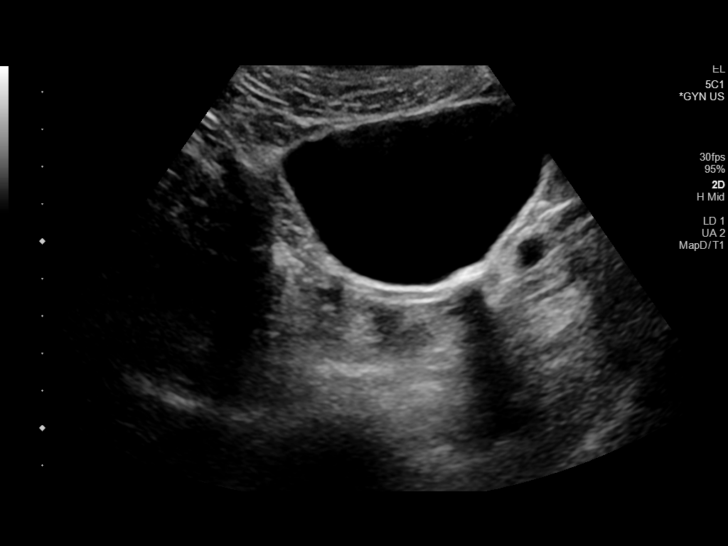
[im 32/128]
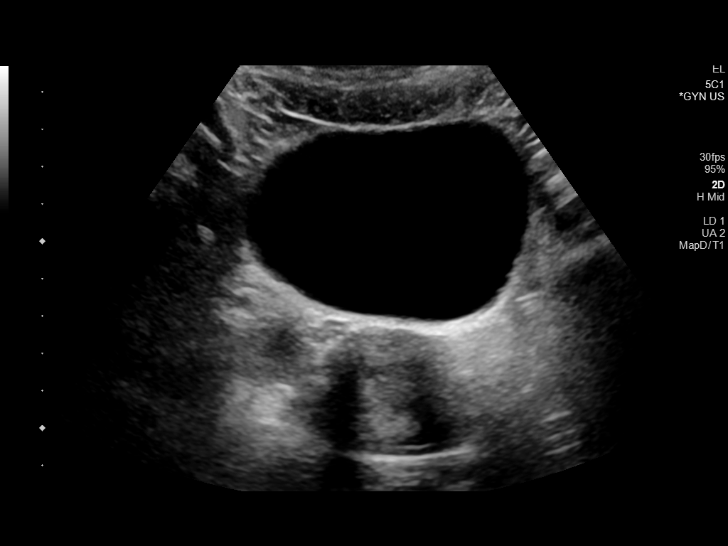
[im 43/128]
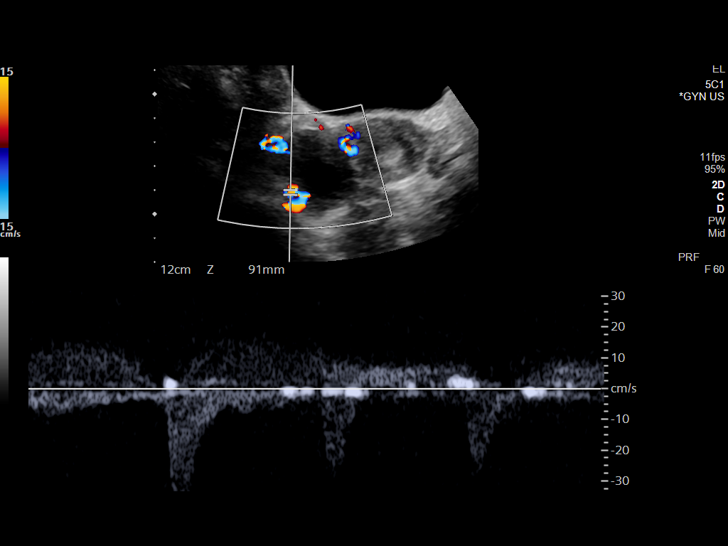
[im 53/128]
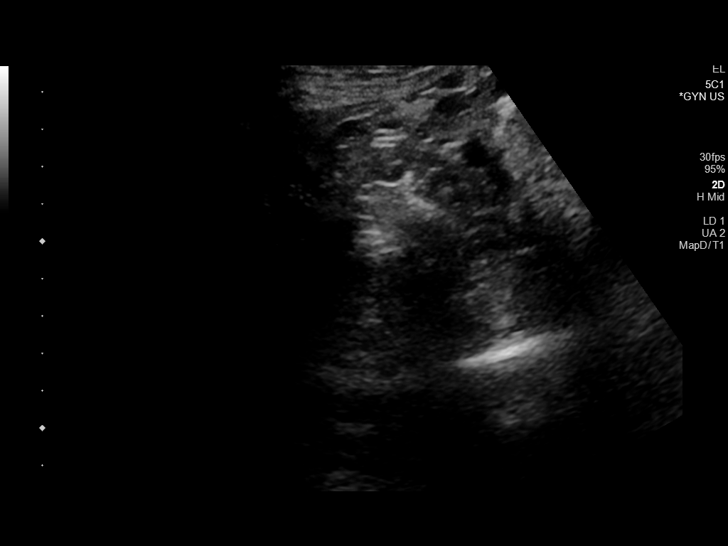
[im 64/128]
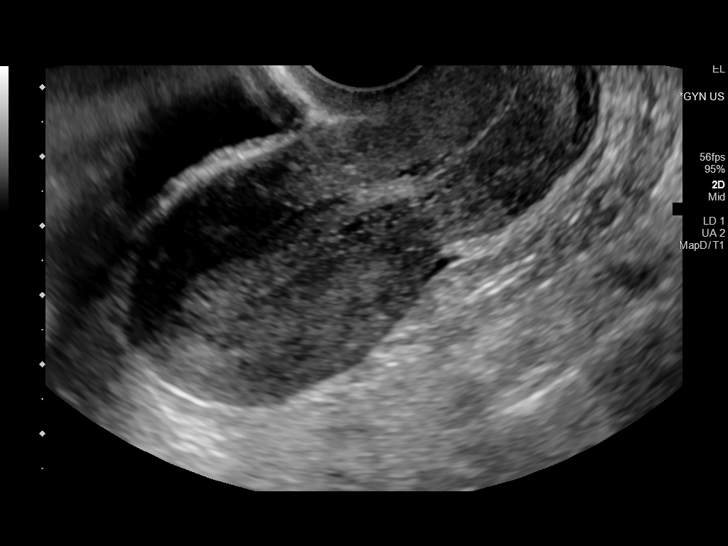
[im 75/128]
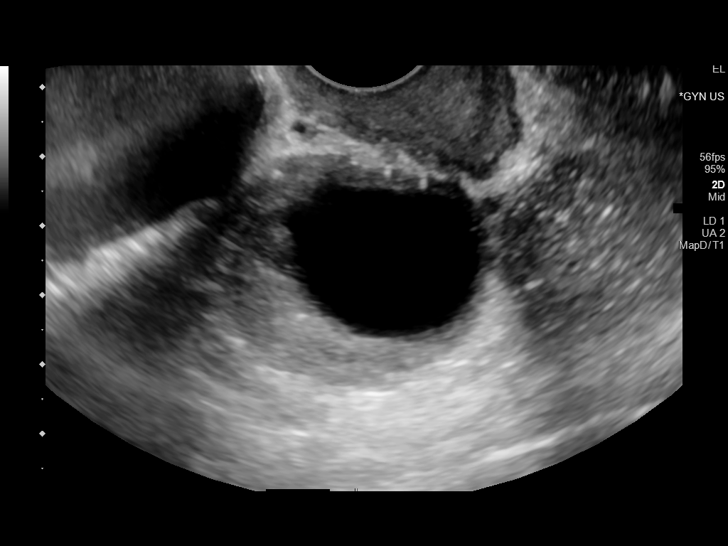
[im 85/128]
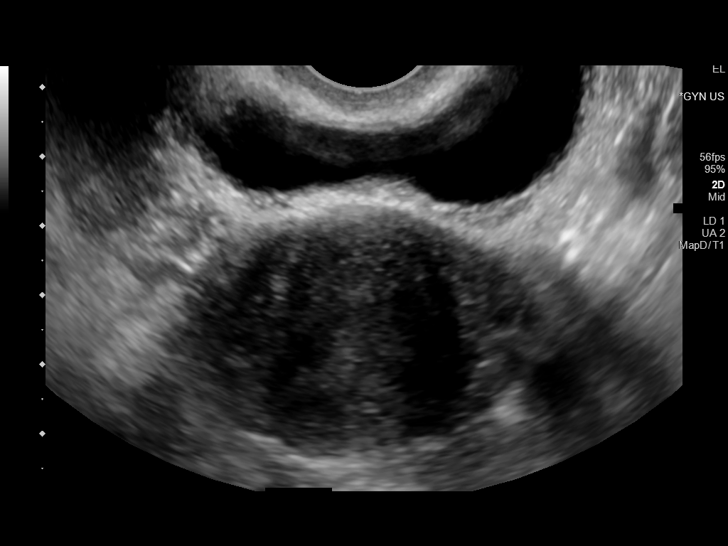
[im 96/128]
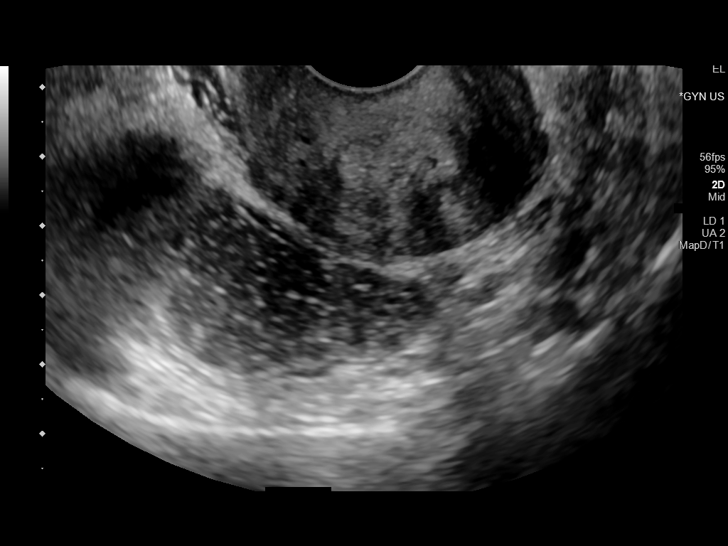
[im 106/128]
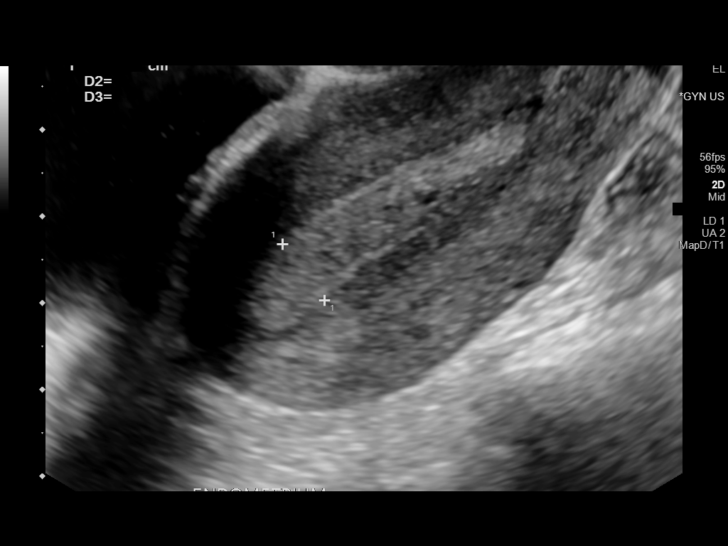
[im 117/128]
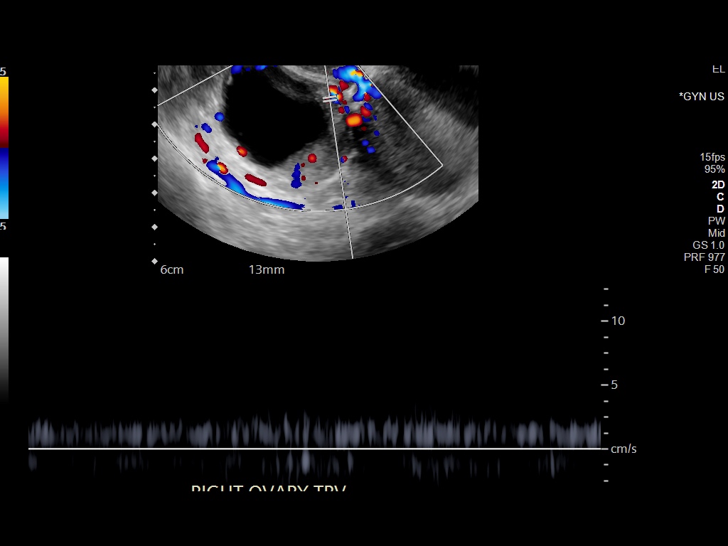
[im 128/128]
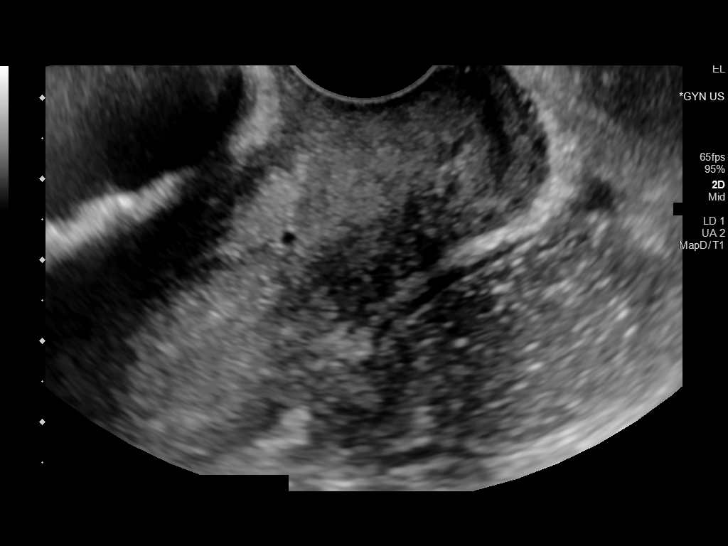

[13 of 25 positions shown; findings below may reference images not displayed]

FINDINGS: Uterus

Measurements: 7.7 x 3.6 x 4.3 cm = volume: 61.7 mL. 1.3 x 0.8 x
cm exophytic densely calcified fibroid seen along the left aspect of
the uterus, also seen on prior CT.

Endometrium

Thickness: 8 mm.  No focal abnormality visualized.

Right ovary

Measurements: 4.7 x 3.7 x 3.8 cm = volume: 35.1 mL. 3.4 x 3.3 x
cm simple anechoic cyst, most compatible with a normal physiologic
follicular cyst.

Left ovary

Not visualized.  No adnexal mass.

Pulsed Doppler evaluation of the right ovary demonstrates normal
low-resistance arterial and venous waveforms.

Other findings

No abnormal free fluid.
IMPRESSION: 1. 3.4 cm simple right ovarian cyst, most compatible with a normal
physiologic follicular cyst. This has benign characteristics and is
a common finding in premenopausal females. No imaging follow up is
required. This follows consensus guidelines: Simple Adnexal Cysts:
SRU Consensus Conference Update on Follow-up and Reporting.
2. Nonvisualization of the left ovary. No left adnexal mass or free
fluid within the pelvis.
[DATE] cm densely calcified fibroid at the left aspect of the
uterus.
4. No evidence for ovarian torsion or other acute abnormality.
